# Patient Record
Sex: Female | Born: 1995 | Race: Black or African American | Hispanic: No | Marital: Single | Smoking: Never smoker
Health system: Southern US, Community
[De-identification: ages and names within clinical notes are randomized; demographics above are authoritative.]

## PROBLEM LIST (undated history)

## (undated) DIAGNOSIS — G43909 Migraine, unspecified, not intractable, without status migrainosus: Secondary | ICD-10-CM

## (undated) HISTORY — PX: HERNIA REPAIR: SHX51

---

## 2016-04-29 ENCOUNTER — Emergency Department (HOSPITAL_COMMUNITY)
Admission: EM | Admit: 2016-04-29 | Discharge: 2016-04-29 | Disposition: A | Discharge status: Home / Self Care | Payer: Self-pay | Attending: Emergency Medicine | Admitting: Emergency Medicine

## 2016-04-29 ENCOUNTER — Encounter (HOSPITAL_COMMUNITY): Payer: Self-pay

## 2016-04-29 DIAGNOSIS — J029 Acute pharyngitis, unspecified: Secondary | ICD-10-CM | POA: Insufficient documentation

## 2016-04-29 HISTORY — DX: Migraine, unspecified, not intractable, without status migrainosus: G43.909

## 2016-04-29 LAB — RAPID STREP SCREEN (MED CTR MEBANE ONLY): STREPTOCOCCUS, GROUP A SCREEN (DIRECT): NEGATIVE

## 2016-04-29 MED ORDER — SODIUM CHLORIDE 0.9 % IV BOLUS (SEPSIS)
1000.0000 mL | Freq: Once | INTRAVENOUS | Status: AC
  Administered 2016-04-29: 1000 mL via INTRAVENOUS

## 2016-04-29 MED ORDER — HYDROCODONE-ACETAMINOPHEN 7.5-325 MG/15ML PO SOLN
15.0000 mL | ORAL | Status: AC
  Administered 2016-04-29: 15 mL via ORAL
  Filled 2016-04-29: qty 15

## 2016-04-29 MED ORDER — CLINDAMYCIN HCL 300 MG PO CAPS: 300.0000 mg | ORAL_CAPSULE | Freq: Four times a day (QID) | ORAL | 0 refills | Status: AC

## 2016-04-29 MED ORDER — IBUPROFEN 800 MG PO TABS: 800.0000 mg | ORAL_TABLET | Freq: Three times a day (TID) | ORAL | 0 refills | Status: AC | PRN

## 2016-04-29 MED ORDER — HYDROCODONE-ACETAMINOPHEN 5-325 MG PO TABS: 1.0000 | ORAL_TABLET | ORAL | 0 refills | Status: AC | PRN

## 2016-04-29 MED ORDER — FLUCONAZOLE 150 MG PO TABS: 150.0000 mg | ORAL_TABLET | Freq: Once | ORAL | 0 refills | Status: AC

## 2016-04-29 MED ORDER — CLINDAMYCIN PHOSPHATE 600 MG/50ML IV SOLN
600.0000 mg | Freq: Once | INTRAVENOUS | Status: AC
  Administered 2016-04-29: 600 mg via INTRAVENOUS
  Filled 2016-04-29: qty 50

## 2016-04-29 MED ORDER — DEXAMETHASONE SODIUM PHOSPHATE 10 MG/ML IJ SOLN
10.0000 mg | Freq: Once | INTRAMUSCULAR | Status: AC
  Administered 2016-04-29: 10 mg via INTRAVENOUS
  Filled 2016-04-29: qty 1

## 2016-04-29 NOTE — ED Provider Notes (Signed)
[facility]-EMERGENCY DEPT Provider Note   CSN: [id number] Arrival date & time: 04/29/16  1015     History   Chief Complaint Chief Complaint  Patient presents with  . Sore Throat  . Otalgia    HPI Victoria Simpson is a 20 y.o. female.  HPI   Patient presents with 6 days of nasal congestion and cough, yesterday began to have more intense sore throat and right ear pain.  Is taking zyrtec, benadryl, tylenol, and motrin without relief.  Is able to drink fluids and keep hydrated.  Denies fevers.    Past Medical History:  Diagnosis Date  . Migraines     There are no active problems to display for this patient.   Past Surgical History:  Procedure Laterality Date  . HERNIA REPAIR      OB History    No data available       Home Medications    Prior to Admission medications   Medication Sig Start Date End Date Taking? Authorizing Provider  cetirizine (ZYRTEC) 10 MG tablet Take 10 mg by mouth daily.   Yes Historical Provider, MD  montelukast (SINGULAIR) 10 MG tablet Take 10 mg by mouth at bedtime.   Yes Historical Provider, MD  naproxen (NAPROSYN) 500 MG tablet Take 500 mg by mouth 2 (two) times daily as needed for headache.   Yes Historical Provider, MD  clindamycin (CLEOCIN) 300 MG capsule Take 1 capsule (300 mg total) by mouth 4 (four) times daily. X 7 days 04/29/16   Trixie DredgeEmily Shazia Mitchener, PA-C  fluconazole (DIFLUCAN) 150 MG tablet Take 1 tablet (150 mg total) by mouth once. If needed for yeast infection following antibiotics 04/29/16 04/29/16  Trixie DredgeEmily Kamiryn Bezanson, PA-C  HYDROcodone-acetaminophen (NORCO/VICODIN) 5-325 MG tablet Take 1-2 tablets by mouth every 4 (four) hours as needed for moderate pain or severe pain. 04/29/16   Trixie DredgeEmily Sicily Zaragoza, PA-C  ibuprofen (ADVIL,MOTRIN) 800 MG tablet Take 1 tablet (800 mg total) by mouth every 8 (eight) hours as needed for mild pain or moderate pain. 04/29/16   Trixie DredgeEmily Detria Cummings, PA-C    Family History History reviewed. No pertinent family history.  Social  History Social History  Substance Use Topics  . Smoking status: Never Smoker  . Smokeless tobacco: Never Used  . Alcohol use Yes     Comment: social     Allergies   Codeine; Pineapple; and Topamax [topiramate]   Review of Systems Review of Systems  Constitutional: Negative for activity change and appetite change.  HENT: Positive for congestion, ear pain and sore throat. Negative for facial swelling and trouble swallowing.   Respiratory: Positive for cough. Negative for shortness of breath.   Cardiovascular: Negative for chest pain.  Musculoskeletal: Negative for neck pain and neck stiffness.  Skin: Negative for rash.  Allergic/Immunologic: Negative for immunocompromised state.     Physical Exam Updated Vital Signs BP 108/56 (BP Location: Right Arm)   Pulse 80   Temp 98.2 F (36.8 C) (Oral)   Resp 16   Ht 5\' 5"  (1.651 m)   Wt 54.4 kg   LMP 03/31/2016   SpO2 100%   BMI 19.97 kg/m   Physical Exam  Constitutional: She appears well-developed and well-nourished. No distress.  HENT:  Head: Normocephalic and atraumatic.  Mouth/Throat: Posterior oropharyngeal edema and posterior oropharyngeal erythema present. No oropharyngeal exudate.  Eyes: Conjunctivae are normal.  Neck: Neck supple.  Cardiovascular: Normal rate and regular rhythm.   Pulmonary/Chest: Effort normal and breath sounds normal. No respiratory distress. She has no  wheezes. She has no rales.  Neurological: She is alert.  Skin: She is not diaphoretic.  Nursing note and vitals reviewed.    ED Treatments / Results  Labs (all labs ordered are listed, but only abnormal results are displayed) Labs Reviewed  RAPID STREP SCREEN (NOT AT [facility])  CULTURE, GROUP A STREP [facility])    EKG  EKG Interpretation None       Radiology No results found.  Procedures Procedures (including critical care time)  Medications Ordered in ED Medications  HYDROcodone-acetaminophen (HYCET) 7.5-325 mg/15 ml solution 15 mL  (15 mLs Oral Given 04/29/16 1133)  sodium chloride 0.9 % bolus 1,000 mL (0 mLs Intravenous Stopped 04/29/16 1402)  dexamethasone (DECADRON) injection 10 mg (10 mg Intravenous Given 04/29/16 1212)  clindamycin (CLEOCIN) IVPB 600 mg (0 mg Intravenous Stopped 04/29/16 1308)     Initial Impression / Assessment and Plan / ED Course  I have reviewed the triage vital signs and the nursing notes.  Pertinent labs & imaging results that were available during my care of the patient were reviewed by me and considered in my medical decision making (see chart for details).  Clinical Course as of Apr 29 1737  Thu Apr 29, 2016  1226 I spoke with Dr Pollyann Kennedyosen who will see patient in his office in the next 1-2 days if not improving.    [EW]    Clinical Course User Index [EW] Trixie DredgeEmily Ezma Rehm, PA-C    Patient with 6 days of URI now with more severe sore throat, right sided pharynx concerning for small peritonsillar abscess.  Strep screen is negative.  Given clinical concern, will treat.  Clindamycin, decadron, IVF given in ED.  Airway is widely patent.  Pt tolerating PO easily.  Discussed pt with Dr Pollyann Kennedyosen who agrees with plan for treatment with antibiotics and outpatient follow up.  D/C home.  Discussed result, findings, treatment, and follow up  with patient.  Pt given return precautions.  Pt verbalizes understanding and agrees with plan.       Final Clinical Impressions(s) / ED Diagnoses   Final diagnoses:  Pharyngitis, unspecified etiology    New Prescriptions Discharge Medication List as of 04/29/2016  2:18 PM    START taking these medications   Details  clindamycin (CLEOCIN) 300 MG capsule Take 1 capsule (300 mg total) by mouth 4 (four) times daily. X 7 days, Starting Thu 04/29/2016, Print    fluconazole (DIFLUCAN) 150 MG tablet Take 1 tablet (150 mg total) by mouth once. If needed for yeast infection following antibiotics, Starting Thu 04/29/2016, Print    HYDROcodone-acetaminophen (NORCO/VICODIN)  5-325 MG tablet Take 1-2 tablets by mouth every 4 (four) hours as needed for moderate pain or severe pain., Starting Thu 04/29/2016, Print    ibuprofen (ADVIL,MOTRIN) 800 MG tablet Take 1 tablet (800 mg total) by mouth every 8 (eight) hours as needed for mild pain or moderate pain., Starting Thu 04/29/2016, Print         Carter LakeEmily Dakiya Puopolo, PA-C 04/29/16 1738    Laurence Spatesachel Morgan Little, MD 04/30/16 [phone]

## 2016-04-29 NOTE — ED Notes (Signed)
Discharge instructions, follow up care, and prescriptions reviewed with patient. Patient verbalized understanding. 

## 2016-04-29 NOTE — ED Triage Notes (Signed)
Pt started with cold like symptoms on Saturday.  Congestion.  No fever.  Pt now with sore throat and ear pain with swallowing.

## 2016-04-29 NOTE — Discharge Instructions (Signed)
Read the information below.  Use the prescribed medication as directed.  Please discuss all new medications with your pharmacist.  Do not take additional tylenol while taking the prescribed pain medication to avoid overdose.  You may return to the Emergency Department at any time for worsening condition or any new symptoms that concern you.     If you develop high fevers, difficulty swallowing or breathing, or you are unable to tolerate fluids by mouth, return to the ER immediately for a recheck.     I am concerned you may have a small pocket of pus in the back of your throat.  Take the antibiotics as directed and call Dr Pollyann Kennedyosen to be seen in the next 1-2 days if not improving.

## 2016-04-30 ENCOUNTER — Encounter (HOSPITAL_COMMUNITY): Payer: Self-pay | Admitting: Emergency Medicine

## 2016-04-30 ENCOUNTER — Emergency Department (HOSPITAL_COMMUNITY)
Admission: EM | Admit: 2016-04-30 | Discharge: 2016-04-30 | Disposition: A | Discharge status: Home / Self Care | Payer: Self-pay | Attending: Emergency Medicine | Admitting: Emergency Medicine

## 2016-04-30 DIAGNOSIS — R112 Nausea with vomiting, unspecified: Secondary | ICD-10-CM | POA: Insufficient documentation

## 2016-04-30 MED ORDER — ONDANSETRON 4 MG PO TBDP
4.0000 mg | ORAL_TABLET | Freq: Once | ORAL | Status: AC
  Administered 2016-04-30: 4 mg via ORAL
  Filled 2016-04-30: qty 1

## 2016-04-30 MED ORDER — ONDANSETRON 4 MG PO TBDP: 4.0000 mg | ORAL_TABLET | Freq: Three times a day (TID) | ORAL | 0 refills | Status: AC | PRN

## 2016-04-30 NOTE — ED Triage Notes (Signed)
Patient states she has sore throat even with medication which was administered 04-29-16.  She states she eats before taking medication but is sick to stomach and is nausea/vomitting with medication x 1 day.

## 2016-04-30 NOTE — ED Provider Notes (Signed)
[facility]-EMERGENCY DEPT Provider Note   CSN: [id number] Arrival date & time: 04/30/16  1530     History   Chief Complaint Chief Complaint  Patient presents with  . Nausea  . Sore Throat    HPI Victoria Simpson is a 20 y.o. female.  HPI 20 year old female who was seen here yesterday for pharyngitis. GAS negative on culture. Given clinda regimen and norco. Here today for nausea and vomiting associated with medication. Not sure which causes the nausea and vomting. Vomiting NBNB. No diarrhea, fever, trouble swallowing, oral swelling, SOB, chest pain. No relieving factors.  Past Medical History:  Diagnosis Date  . Migraines     There are no active problems to display for this patient.   Past Surgical History:  Procedure Laterality Date  . HERNIA REPAIR      OB History    No data available       Home Medications    Prior to Admission medications   Medication Sig Start Date End Date Taking? Authorizing Provider  cetirizine (ZYRTEC) 10 MG tablet Take 10 mg by mouth daily.   Yes Historical Provider, MD  clindamycin (CLEOCIN) 300 MG capsule Take 1 capsule (300 mg total) by mouth 4 (four) times daily. X 7 days 04/29/16  Yes Trixie DredgeEmily West, PA-C  HYDROcodone-acetaminophen (NORCO/VICODIN) 5-325 MG tablet Take 1-2 tablets by mouth every 4 (four) hours as needed for moderate pain or severe pain. 04/29/16  Yes Trixie DredgeEmily West, PA-C  montelukast (SINGULAIR) 10 MG tablet Take 10 mg by mouth at bedtime.   Yes Historical Provider, MD  naproxen (NAPROSYN) 500 MG tablet Take 500 mg by mouth 2 (two) times daily as needed for headache.   Yes Historical Provider, MD  ibuprofen (ADVIL,MOTRIN) 800 MG tablet Take 1 tablet (800 mg total) by mouth every 8 (eight) hours as needed for mild pain or moderate pain. Patient not taking: Reported on 04/30/2016 04/29/16   Trixie DredgeEmily West, PA-C  ondansetron (ZOFRAN ODT) 4 MG disintegrating tablet Take 1 tablet (4 mg total) by mouth every 8 (eight) hours as needed for  nausea or vomiting. 04/30/16 05/05/16  Nira ConnPedro Eduardo Zaiya Annunziato, MD    Family History No family history on file.  Social History Social History  Substance Use Topics  . Smoking status: Never Smoker  . Smokeless tobacco: Never Used  . Alcohol use Yes     Comment: social     Allergies   Codeine; Pineapple; and Topamax [topiramate]   Review of Systems Review of Systems Ten systems are reviewed and are negative for acute change except as noted in the HPI   Physical Exam Updated Vital Signs BP 129/80 (BP Location: Right Arm)   Pulse 69   Temp 97.6 F (36.4 C) (Oral)   Resp 16   Ht 5\' 5"  (1.651 m)   Wt 120 lb (54.4 kg)   LMP 03/31/2016   SpO2 100%   BMI 19.97 kg/m   Physical Exam  Constitutional: She is oriented to person, place, and time. She appears well-developed and well-nourished. No distress.  HENT:  Head: Normocephalic and atraumatic.  Nose: Nose normal.  Mouth/Throat: Tonsillar abscesses (right) present. No oropharyngeal exudate, posterior oropharyngeal edema or posterior oropharyngeal erythema. Tonsils are 2+ on the right.  Eyes: Conjunctivae and EOM are normal. Pupils are equal, round, and reactive to light. Right eye exhibits no discharge. Left eye exhibits no discharge. No scleral icterus.  Neck: Normal range of motion. Neck supple.  Cardiovascular: Normal rate and regular rhythm.  Exam reveals  no gallop and no friction rub.   No murmur heard. Pulmonary/Chest: Effort normal and breath sounds normal. No stridor. No respiratory distress. She has no rales.  Abdominal: Soft. She exhibits no distension. There is no tenderness.  Musculoskeletal: She exhibits no edema or tenderness.  Neurological: She is alert and oriented to person, place, and time.  Skin: Skin is warm and dry. No rash noted. She is not diaphoretic. No erythema.  Psychiatric: She has a normal mood and affect.  Vitals reviewed.    ED Treatments / Results  Labs (all labs ordered are listed, but  only abnormal results are displayed) Labs Reviewed - No data to display  EKG  EKG Interpretation None       Radiology No results found.  Procedures Procedures (including critical care time)  Medications Ordered in ED Medications  ondansetron (ZOFRAN-ODT) disintegrating tablet 4 mg (4 mg Oral Given 04/30/16 1739)     Initial Impression / Assessment and Plan / ED Course  I have reviewed the triage vital signs and the nursing notes.  Pertinent labs & imaging results that were available during my care of the patient were reviewed by me and considered in my medical decision making (see chart for details).  Clinical Course     Doubt allergic reaction or anaphylaxis. Likely side effect from medication. Patient is well-appearing, well-hydrated, nontoxic. Given Zofran and able to tolerate her clindamycin in the ED. Monitored for an additional hour without emesis.  Safe for discharge with strict return precautions.  Final Clinical Impressions(s) / ED Diagnoses   Final diagnoses:  Non-intractable vomiting with nausea, unspecified vomiting type   Disposition: Discharge  Condition: Good  I have discussed the results, Dx and Tx plan with the patient who expressed understanding and agree(s) with the plan. Discharge instructions discussed at great length. The patient was given strict return precautions who verbalized understanding of the instructions. No further questions at time of discharge.    New Prescriptions   ONDANSETRON (ZOFRAN ODT) 4 MG DISINTEGRATING TABLET    Take 1 tablet (4 mg total) by mouth every 8 (eight) hours as needed for nausea or vomiting.    Follow Up: Primary care provider  Schedule an appointment as soon as possible for a visit  As needed      Nira ConnPedro Eduardo Bailen Geffre, MD 04/30/16 1840

## 2016-04-30 NOTE — ED Notes (Signed)
EDP PEDRO ALLOWED PT TO TAKE HOME MEDICATION. CLEOCIN 300MG . PT TOLERATED

## 2016-05-01 LAB — CULTURE, GROUP A STREP (THRC)

## 2017-08-25 ENCOUNTER — Emergency Department (HOSPITAL_COMMUNITY)
Admission: EM | Admit: 2017-08-25 | Discharge: 2017-08-25 | Disposition: A | Discharge status: Home / Self Care | Payer: BLUE CROSS/BLUE SHIELD | Attending: Emergency Medicine | Admitting: Emergency Medicine

## 2017-08-25 ENCOUNTER — Other Ambulatory Visit: Payer: Self-pay

## 2017-08-25 ENCOUNTER — Encounter: Payer: Self-pay | Admitting: Emergency Medicine

## 2017-08-25 ENCOUNTER — Encounter (HOSPITAL_COMMUNITY): Payer: Self-pay

## 2017-08-25 ENCOUNTER — Emergency Department (HOSPITAL_COMMUNITY): Payer: BLUE CROSS/BLUE SHIELD

## 2017-08-25 DIAGNOSIS — Y999 Unspecified external cause status: Secondary | ICD-10-CM | POA: Diagnosis not present

## 2017-08-25 DIAGNOSIS — Z23 Encounter for immunization: Secondary | ICD-10-CM | POA: Insufficient documentation

## 2017-08-25 DIAGNOSIS — W540XXA Bitten by dog, initial encounter: Secondary | ICD-10-CM | POA: Insufficient documentation

## 2017-08-25 DIAGNOSIS — Y9301 Activity, walking, marching and hiking: Secondary | ICD-10-CM | POA: Diagnosis not present

## 2017-08-25 DIAGNOSIS — Y92038 Other place in apartment as the place of occurrence of the external cause: Secondary | ICD-10-CM | POA: Diagnosis not present

## 2017-08-25 DIAGNOSIS — T148XXA Other injury of unspecified body region, initial encounter: Secondary | ICD-10-CM

## 2017-08-25 DIAGNOSIS — S51052A Open bite, left elbow, initial encounter: Secondary | ICD-10-CM | POA: Insufficient documentation

## 2017-08-25 LAB — POC URINE PREG, ED: PREG TEST UR: NEGATIVE

## 2017-08-25 MED ORDER — FLUCONAZOLE 150 MG PO TABS: 150.0000 mg | ORAL_TABLET | Freq: Once | ORAL | 0 refills | Status: AC

## 2017-08-25 MED ORDER — RABIES IMMUNE GLOBULIN 150 UNIT/ML IM INJ
20.0000 [IU]/kg | INJECTION | Freq: Once | INTRAMUSCULAR | Status: AC
  Administered 2017-08-25: 1125 [IU] via INTRAMUSCULAR
  Filled 2017-08-25: qty 8

## 2017-08-25 MED ORDER — RABIES VACCINE, PCEC IM SUSR
1.0000 mL | Freq: Once | INTRAMUSCULAR | Status: AC
  Administered 2017-08-25: 1 mL via INTRAMUSCULAR
  Filled 2017-08-25: qty 1

## 2017-08-25 MED ORDER — AMOXICILLIN-POT CLAVULANATE 875-125 MG PO TABS: 1.0000 | ORAL_TABLET | Freq: Two times a day (BID) | ORAL | 0 refills | Status: AC

## 2017-08-25 NOTE — Progress Notes (Signed)
Erroneous encounter

## 2017-08-25 NOTE — ED Notes (Signed)
Bed: WTR9 Expected date:  Expected time:  Means of arrival:  Comments: 

## 2017-08-25 NOTE — Discharge Instructions (Signed)
Please see the information and instructions below regarding your visit.  Your diagnoses today include:  1. Animal bite     Tests performed today include: See side panel of your discharge paperwork for testing performed today. Vital signs are listed at the bottom of these instructions.   X-ray of your left elbow showed no foreign bodies from the animal bite.  Medications prescribed:    Take any prescribed medications only as prescribed, and any over the counter medications only as directed on the packaging.  Please take Augmentin for 5 days.  This will prevent infection.  You may take 1 dose of Diflucan 24 hours after finishing Augmentin.  Please take naproxen for the discomfort in the arm.  Women who are pregnant, breastfeeding, or planning on becoming pregnant should not take non-steroidal anti-inflammatories such as   You may combine this medication with Tylenol, 650 mg every 6 hours, so you are receiving something for pain every 3 hours.  This is not a long-term medication unless under the care and direction of your primary provider. Taking this medication long-term and not under the supervision of a healthcare provider could increase the risk of stomach ulcers, kidney problems, and cardiovascular problems such as high blood pressure.    Home care instructions:  Please follow any educational materials contained in this packet.   Continue to wash with warm soap and water and no chemicals.  Follow-up instructions: [facility] for the remainder of your rabies vaccinations, as well as for wound checks.  Return instructions:  Please return to the Emergency Department if you experience worsening symptoms.  Please return to the emergency department for any increasing redness around the site, or drainage is green or yellow. Please return if you have any other emergent concerns.  Additional Information:   Your vital signs  today were: BP 121/80 (BP Location: Right Arm)    Pulse 82    Temp 98.1 F (36.7 C) (Oral)    Resp 18    Ht 5\' 6"  (1.676 m)    Wt 54.9 kg (121 lb)    LMP 08/23/2017    SpO2 100%    BMI 19.53 kg/m  If your blood pressure (BP) was elevated on multiple readings during this visit above 130 for the top number or above 80 for the bottom number, please have this repeated by your primary care provider within one month. --------------  Thank you for allowing us to participate in your care today.

## 2017-08-25 NOTE — ED Provider Notes (Signed)
[facility]-EMERGENCY DEPT Provider Note   CSN: [id number] Arrival date & time: 08/25/17  1102     History   Chief Complaint Chief Complaint  Patient presents with  . Animal Bite    HPI Victoria Simpson is a 22 y.o. female.  HPI   Patient is a 22 year old female with a history of migraines presenting for a dog bite to her left elbow.  Patient reports that the incident occurred 24 hours ago.  Patient reports that an unknown person in her apartment complex was juggling multiple items while carrying a dog leash when a pit bull broke free, and lunch towards her.  Patient reports that she felt that the animal was acting erratic.  Patient was unable to talk to the owner after this occurred, and is finally report with her apartment complex, however they think that the owner may be visit her, and she does not but the description of any resident.  Patient reports that it tore her jacket, and broke the surface of her skin.  Patient did not notice that she had a bite contacting the skin until she got to work.  Patient reports she copiously washed it at work.  Patient reports that the left elbow has been progressively more sore since this incident, but she has not noticed any increasing erythema or edema around the site.  No loss of range of motion.  No numbness, tingling, or weakness distal to the injury.  Past Medical History:  Diagnosis Date  . Migraines     There are no active problems to display for this patient.   Past Surgical History:  Procedure Laterality Date  . HERNIA REPAIR       OB History   None      Home Medications    Prior to Admission medications   Medication Sig Start Date End Date Taking? Authorizing Provider  cetirizine (ZYRTEC) 10 MG tablet Take 10 mg by mouth daily.   Yes [provider]  montelukast (SINGULAIR) 10 MG tablet Take 10 mg by mouth at bedtime.   Yes [provider]  naproxen (NAPROSYN) 500 MG tablet Take  500 mg by mouth 2 (two) times daily as needed for headache.   Yes [provider]  clindamycin (CLEOCIN) 300 MG capsule Take 1 capsule (300 mg total) by mouth 4 (four) times daily. X 7 days Patient not taking: Reported on 08/25/2017 04/29/16   Trixie DredgeWest, Emily, PA-C  HYDROcodone-acetaminophen (NORCO/VICODIN) 5-325 MG tablet Take 1-2 tablets by mouth every 4 (four) hours as needed for moderate pain or severe pain. Patient not taking: Reported on 08/25/2017 04/29/16   Trixie DredgeWest, Emily, PA-C  ibuprofen (ADVIL,MOTRIN) 800 MG tablet Take 1 tablet (800 mg total) by mouth every 8 (eight) hours as needed for mild pain or moderate pain. Patient not taking: Reported on 04/30/2016 04/29/16   Trixie DredgeWest, Emily, PA-C    Family History History reviewed. No pertinent family history.  Social History Social History   Tobacco Use  . Smoking status: Never Smoker  . Smokeless tobacco: Never Used  Substance Use Topics  . Alcohol use: Yes    Comment: social  . Drug use: No     Allergies   Codeine; Pineapple; and Topamax [topiramate]   Review of Systems Review of Systems  Musculoskeletal: Negative for joint swelling.  Skin: Positive for wound.  Neurological: Negative for weakness and numbness.     Physical Exam Updated Vital Signs BP 121/80 (BP Location: Right Arm)   Pulse 82  Temp 98.1 F (36.7 C) (Oral)   Resp 18   Ht 5\' 6"  (1.676 m)   Wt 54.9 kg (121 lb)   LMP 08/23/2017   SpO2 100%   BMI 19.53 kg/m   Physical Exam  Constitutional: She appears well-developed and well-nourished. No distress.  Sitting comfortably in bed.  HENT:  Head: Normocephalic and atraumatic.  Eyes: Conjunctivae are normal. Right eye exhibits no discharge. Left eye exhibits no discharge.  EOMs normal to gross examination.  Neck: Normal range of motion.  Cardiovascular: Normal rate and regular rhythm.  Intact, 2+ radial pulses bilaterally.  Pulmonary/Chest:  Normal respiratory effort. Patient converses comfortably.  No audible wheeze or stridor.  Abdominal: She exhibits no distension.  Musculoskeletal: Normal range of motion.  Neurological: She is alert.  Cranial nerves intact to gross observation. Patient moves extremities without difficulty.  Skin: Skin is warm and dry. She is not diaphoretic.  There are 3 punctate lesions of the left lateral elbow.  There is superficial in nature.  No spreading erythema.  No drainage.  Psychiatric: She has a normal mood and affect. Her behavior is normal. Judgment and thought content normal.  Nursing note and vitals reviewed.    ED Treatments / Results  Labs (all labs ordered are listed, but only abnormal results are displayed) Labs Reviewed  POC URINE PREG, ED    EKG None  Radiology No results found.  Procedures Procedures (including critical care time)  Medications Ordered in ED Medications  rabies immune globulin (HYPERAB/KEDRAB) injection 1,125 Units (has no administration in time range)  rabies vaccine (RABAVERT) injection 1 mL (has no administration in time range)     Initial Impression / Assessment and Plan / ED Course  I have reviewed the triage vital signs and the nursing notes.  Pertinent labs & imaging results that were available during my care of the patient were reviewed by me and considered in my medical decision making (see chart for details).     Patient is nontoxic-appearing and in no acute distress.  There are some concerns about the ability to track down the animal and determine vaccination status so I discussed rabies vaccination with the patient, and she wishes to proceed at this time.  Tdap is up-to-date per patient.  No foreign bodies identified on x-ray.  Will treat with Augmentin given the proximity to the joint, and 12 hours post injury.  Patient to complete the remainder of her rabies vaccination course.  Patient can return precautions for any increasing erythema, or purulent drainage.  Patient is in understanding agrees  with plan of care.  Final Clinical Impressions(s) / ED Diagnoses   Final diagnoses:  Animal bite    ED Discharge Orders        Ordered    amoxicillin-clavulanate (AUGMENTIN) 875-125 MG tablet  Every 12 hours     08/25/17 1238    fluconazole (DIFLUCAN) 150 MG tablet   Once     08/25/17 [address] 08/25/17 1244    Jacalyn Lefevre, MD 08/25/17 1457

## 2017-08-25 NOTE — ED Triage Notes (Signed)
Patient received a dog bite to the left upper forearm area yesterday. Patient states it was a pit bull that was running around in the neighborhood. Patient states that the area is sore.

## 2017-08-28 ENCOUNTER — Ambulatory Visit (HOSPITAL_COMMUNITY)
Admission: EM | Admit: 2017-08-28 | Discharge: 2017-08-28 | Disposition: A | Discharge status: Home / Self Care | Payer: BLUE CROSS/BLUE SHIELD | Attending: Internal Medicine | Admitting: Internal Medicine

## 2017-08-28 MED ORDER — RABIES VACCINE, PCEC IM SUSR
INTRAMUSCULAR | Status: AC
  Filled 2017-08-28: qty 1

## 2017-08-28 MED ORDER — RABIES VACCINE, PCEC IM SUSR
1.0000 mL | Freq: Once | INTRAMUSCULAR | Status: AC
  Administered 2017-08-28: 1 mL via INTRAMUSCULAR

## 2017-08-28 NOTE — ED Triage Notes (Signed)
Pt here for day 3 rabies. ?

## 2017-08-30 ENCOUNTER — Telehealth: Payer: Self-pay

## 2019-06-18 IMAGING — CR DG ELBOW 2V*L*
2 series · 2 of 2 positions shown · non-contrast
Comparison: None.

CLINICAL DATA: Dog bite to the left elbow with pain laterally.
Initial encounter.

EXAM:
LEFT ELBOW - 2 VIEW

[x elbow ap left]
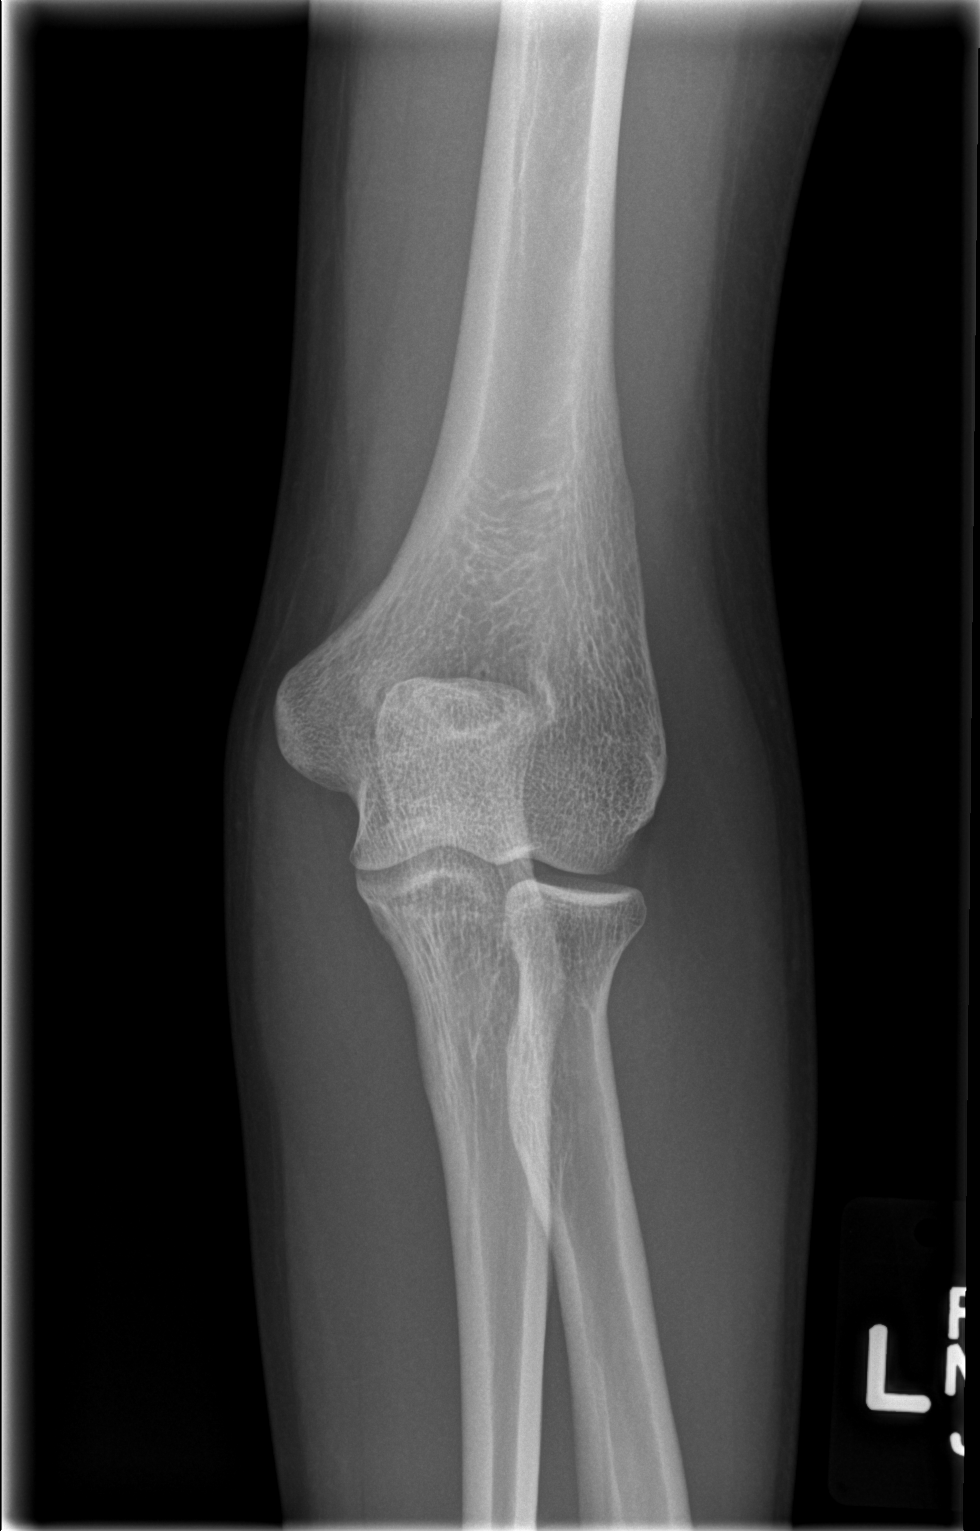

[x elbow lat left]
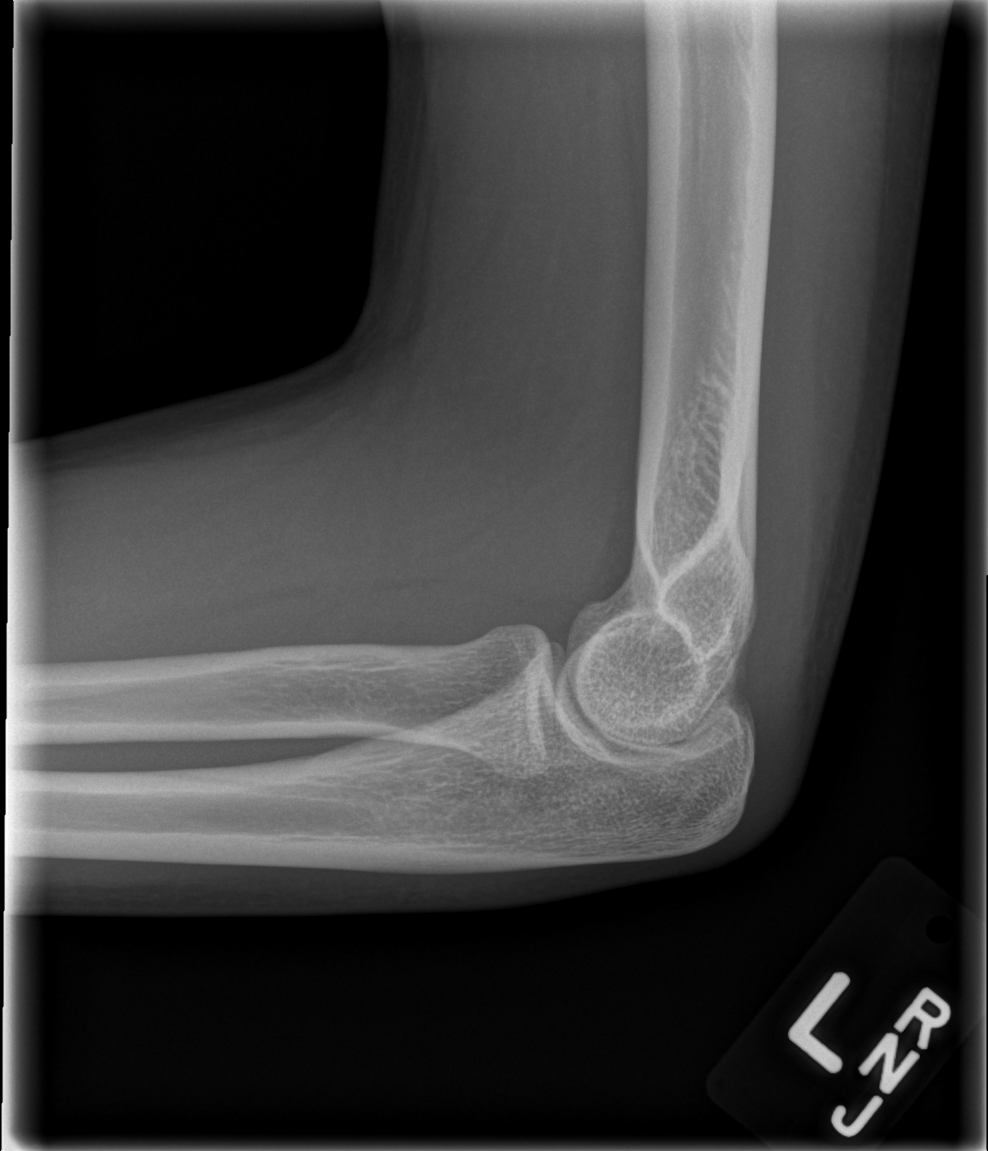

[2 of 2 positions shown; findings below may reference images not displayed]

FINDINGS: There is no evidence of fracture, dislocation, or joint effusion.
There is no evidence of arthropathy or other focal bone abnormality.
Soft tissues are unremarkable.
IMPRESSION: Negative.

## 2019-07-10 NOTE — Telephone Encounter (Signed)
Error
